# Patient Record
Sex: Female | Born: 1970 | Race: White | Hispanic: No | Marital: Married | Smoking: Never smoker
Health system: Southern US, Community
[De-identification: ages and names within clinical notes are randomized; demographics above are authoritative.]

## PROBLEM LIST (undated history)

## (undated) DIAGNOSIS — Z9049 Acquired absence of other specified parts of digestive tract: Secondary | ICD-10-CM

## (undated) HISTORY — PX: BREAST SURGERY: SHX581

## (undated) HISTORY — DX: Acquired absence of other specified parts of digestive tract: Z90.49

---

## 2008-12-31 ENCOUNTER — Ambulatory Visit: Payer: Self-pay | Admitting: Internal Medicine

## 2008-12-31 ENCOUNTER — Telehealth: Payer: Self-pay | Admitting: Internal Medicine

## 2008-12-31 ENCOUNTER — Ambulatory Visit: Payer: Self-pay | Admitting: Diagnostic Radiology

## 2008-12-31 ENCOUNTER — Ambulatory Visit (HOSPITAL_BASED_OUTPATIENT_CLINIC_OR_DEPARTMENT_OTHER): Admission: RE | Admit: 2008-12-31 | Discharge: 2008-12-31 | Payer: Self-pay | Admitting: Internal Medicine

## 2008-12-31 DIAGNOSIS — M545 Low back pain, unspecified: Secondary | ICD-10-CM | POA: Insufficient documentation

## 2011-03-03 IMAGING — CR DG LUMBAR SPINE 2-3V
3 series · 3 of 3 positions shown · non-contrast
Comparison: None

CLINICAL DATA: Chronic low back pain.

LUMBAR SPINE - 2-3 VIEW

[t l-spine a.p.]
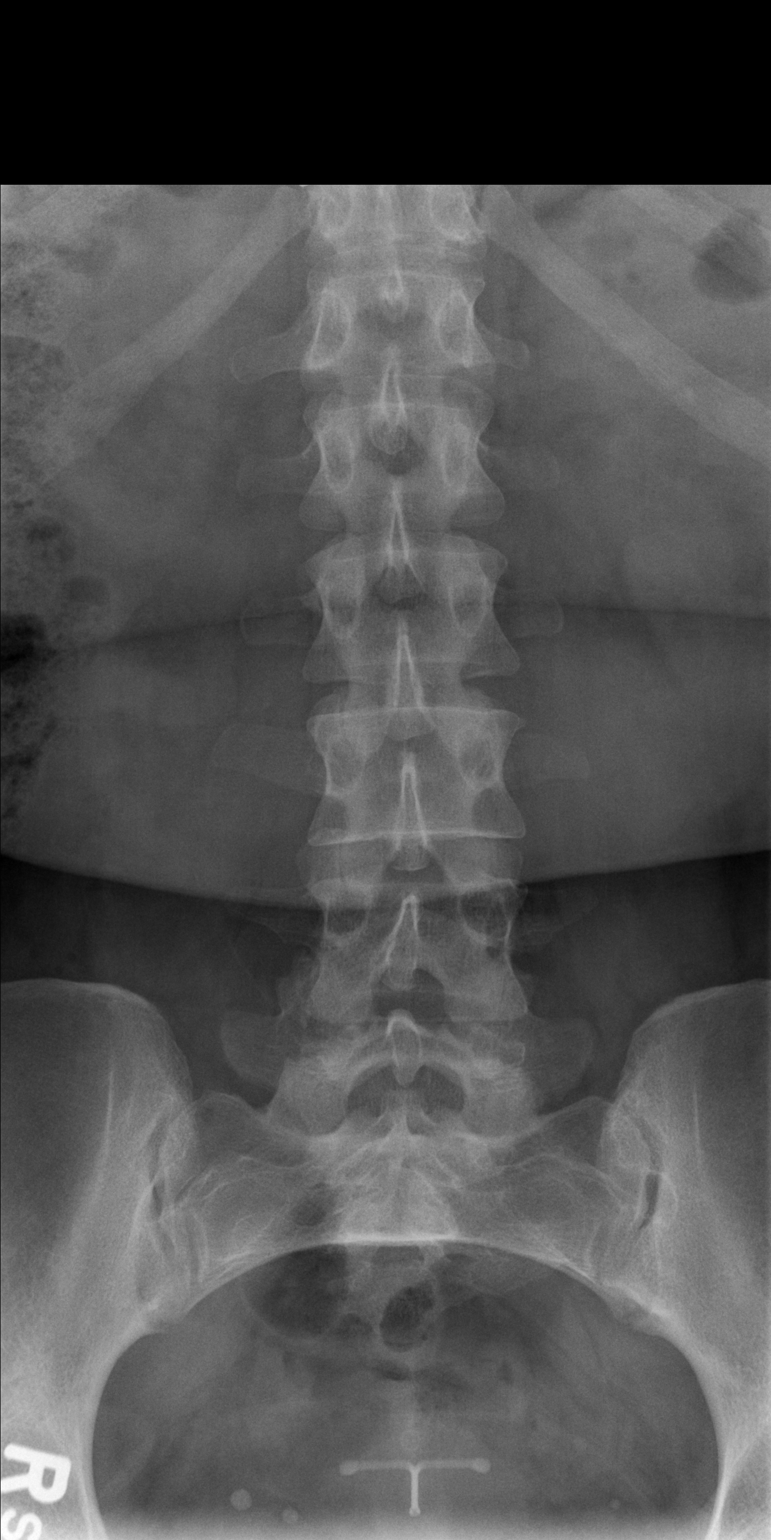

[t l-spine lat]
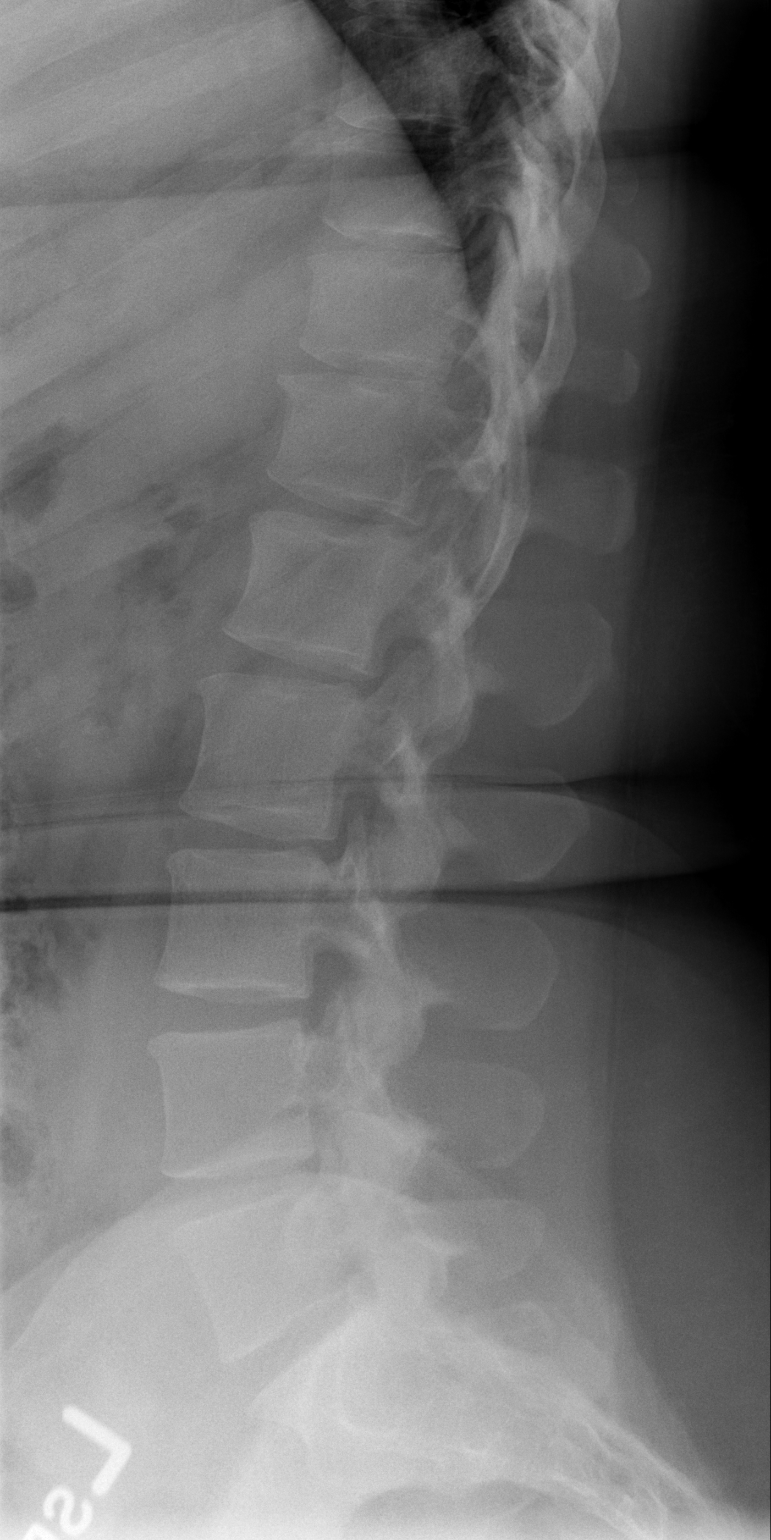

[t l-spine l5-s1 spot]
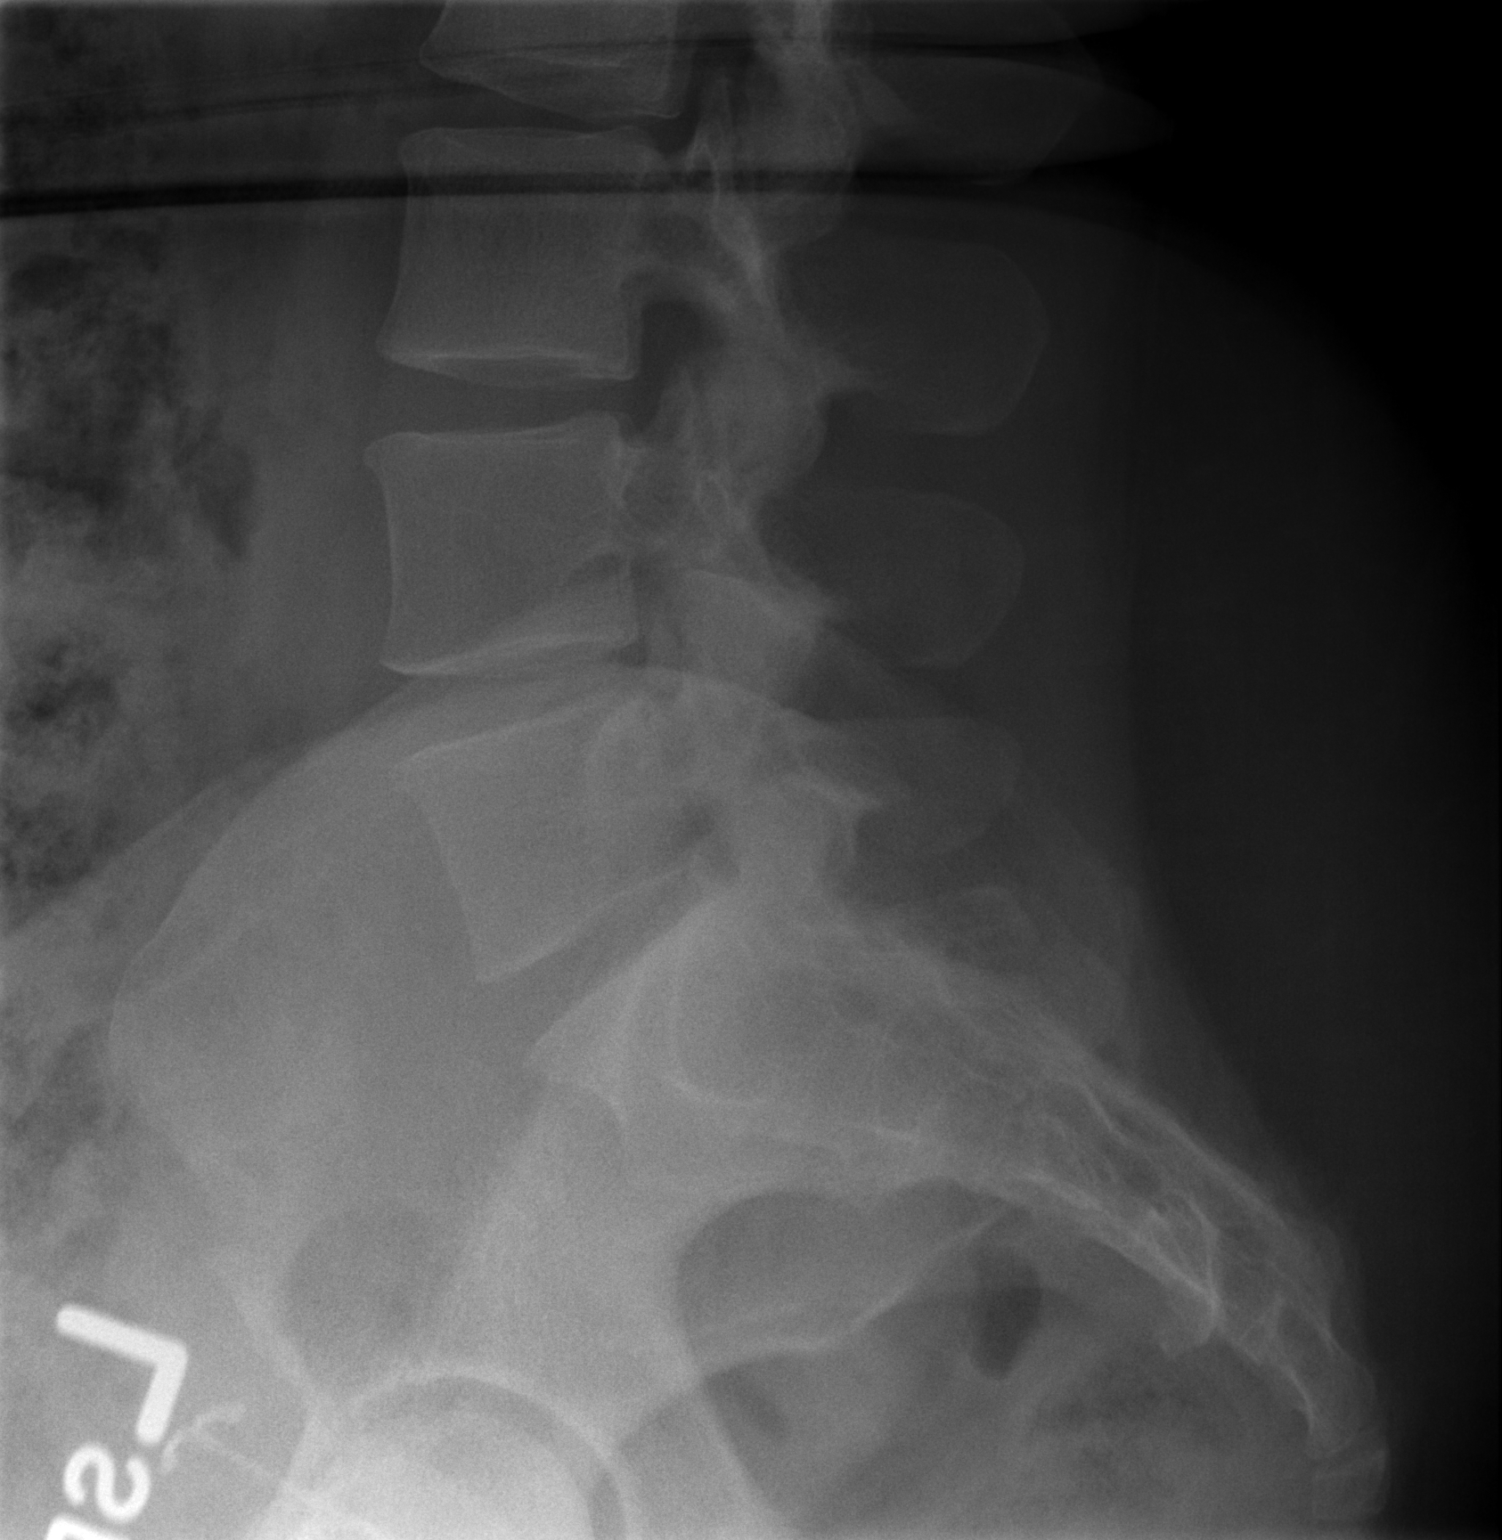

[3 of 3 positions shown; findings below may reference images not displayed]

FINDINGS: The lateral film demonstrates normal alignment of the
lumbar vertebral bodies.  Disc spaces and vertebral bodies are
maintained.  No acute bony findings.  No definite pars defects.
The visualized bony pelvis is intact.  An IUD is noted in the
pelvis.
IMPRESSION: Normal alignment and no acute bony findings.

## 2015-07-05 ENCOUNTER — Ambulatory Visit (INDEPENDENT_AMBULATORY_CARE_PROVIDER_SITE_OTHER): Payer: BC Managed Care – PPO | Admitting: Sports Medicine

## 2015-07-05 VITALS — BP 154/87 | HR 87 | Resp 18 | Wt 241.8 lb

## 2015-07-05 DIAGNOSIS — M722 Plantar fascial fibromatosis: Secondary | ICD-10-CM

## 2015-07-05 DIAGNOSIS — B070 Plantar wart: Secondary | ICD-10-CM | POA: Diagnosis not present

## 2015-07-05 MED ORDER — MELOXICAM 15 MG PO TABS: ORAL_TABLET | ORAL | Status: DC

## 2015-07-05 NOTE — Assessment & Plan Note (Signed)
Cryotherapy 2

## 2015-07-05 NOTE — Progress Notes (Signed)
   Subjective:    I'm seeing this patient as a consultation for:  Holly Cloud, NP  CC: Left heel pain  HPI: This is a pleasant 45 year old female has had a several months history of pain that she localizes on the plantar aspect of her  Left heel, severe, persistent, she's been through formal physical therapy, dry needling without any improvement. She is referred here for custom orthotics, further management, and definitive treatment. She has never had injections.  Plantar wart: Left-sided, Has not responded to topical agents prescribed by another provider.  Past medical history, Surgical history, Family history not pertinant except as noted below, Social history, Allergies, and medications have been entered into the medical record, reviewed, and no changes needed.   Review of Systems: No headache, visual changes, nausea, vomiting, diarrhea, constipation, dizziness, abdominal pain, skin rash, fevers, chills, night sweats, weight loss, swollen lymph nodes, body aches, joint swelling, muscle aches, chest pain, shortness of breath, mood changes, visual or auditory hallucinations.   Objective:   General: Well Developed, well nourished, and in no acute distress.  Neuro/Psych: Alert and oriented x3, extra-ocular muscles intact, able to move all 4 extremities, sensation grossly intact. Skin: Warm and dry, no rashes noted.  Respiratory: Not using accessory muscles, speaking in full sentences, trachea midline.  Cardiovascular: Pulses palpable, no extremity edema. Abdomen: Does not appear distended. Left Foot: No visible erythema or swelling. Range of motion is full in all directions. Strength is 5/5 in all directions. No hallux valgus. No pes cavus or pes planus. No abnormal callus noted. No pain over the navicular prominence, or base of fifth metatarsal. Severely tender to palpation of the calcaneal insertion of plantar fascia. No pain at the Achilles insertion. No pain over the calcaneal  bursa. No pain of the retrocalcaneal bursa. No tenderness to palpation over the tarsals, metatarsals, or phalanges. No hallux rigidus or limitus. No tenderness palpation over interphalangeal joints. No pain with compression of the metatarsal heads. Neurovascularly intact distally.  Patient was fitted for a : standard, cushioned, semi-rigid orthotic. The orthotic was heated and afterward the patient stood on the orthotic blank positioned on the orthotic stand. The patient was positioned in subtalar neutral position and 10 degrees of ankle dorsiflexion in a weight bearing stance. After completion of molding, a stable base was applied to the orthotic blank. The blank was ground to a stable position for weight bearing. Size: 7 Base: [employer] safety [employer] and Padding: None The patient ambulated these, and they were very comfortable.  Procedure:  Cryodestruction of 2 left foot plantar warts Consent obtained and verified. Time-out conducted. Noted no overlying erythema, induration, or other signs of local infection. Completed without difficulty using Cryo-Gun. Advised to call if fevers/chills, erythema, induration, drainage, or persistent bleeding.  Impression and Recommendations:   This case required medical decision making of moderate complexity.

## 2015-07-05 NOTE — Assessment & Plan Note (Signed)
Custom orthotics as above, adding meloxicam, continue rehabilitation exercises, return in 2 weeks, injection if no better.

## 2015-07-19 ENCOUNTER — Ambulatory Visit (INDEPENDENT_AMBULATORY_CARE_PROVIDER_SITE_OTHER): Payer: BC Managed Care – PPO | Admitting: Sports Medicine

## 2015-07-19 VITALS — BP 117/84 | HR 84 | Wt 242.0 lb

## 2015-07-19 DIAGNOSIS — M722 Plantar fascial fibromatosis: Secondary | ICD-10-CM | POA: Diagnosis not present

## 2015-07-19 DIAGNOSIS — B070 Plantar wart: Secondary | ICD-10-CM

## 2015-07-19 DIAGNOSIS — L918 Other hypertrophic disorders of the skin: Secondary | ICD-10-CM

## 2015-07-19 NOTE — Assessment & Plan Note (Signed)
Cryotherapy of multiple skin tags on the neck and abdomen

## 2015-07-19 NOTE — Assessment & Plan Note (Signed)
Doing well and nearly resolved with meloxicam, rehabilitation, and custom orthotics. Continues to improve, return as needed.

## 2015-07-19 NOTE — Progress Notes (Signed)
  Subjective:    CC: follow-up  HPI: Plantar fasciitis: Resolving with custom orthotics, rehabilitation, meloxicam.  Plantar wart: Cryotherapy 2, improved but still present  Skin tags: Multiple on the neck and abdomen, desires cryotherapy.  Past medical history, Surgical history, Family history not pertinant except as noted below, Social history, Allergies, and medications have been entered into the medical record, reviewed, and no changes needed.   Review of Systems: No fevers, chills, night sweats, weight loss, chest pain, or shortness of breath.   Objective:    General: Well Developed, well nourished, and in no acute distress.  Neuro: Alert and oriented x3, extra-ocular muscles intact, sensation grossly intact.  HEENT: Normocephalic, atraumatic, pupils equal round reactive to light, neck supple, no masses, no lymphadenopathy, thyroid nonpalpable.  Skin: Warm and dry, no rashes. Cardiac: Regular rate and rhythm, no murmurs rubs or gallops, no lower extremity edema.  Respiratory: Clear to auscultation bilaterally. Not using accessory muscles, speaking in full sentences.  Procedure:  Cryodestruction of left foot plantar wart 2 Consent obtained and verified. Time-out conducted. Noted no overlying erythema, induration, or other signs of local infection. Completed without difficulty using Cryo-Gun. Advised to call if fevers/chills, erythema, induration, drainage, or persistent bleeding.  Procedure:  Cryodestruction of approximately 10 skin tags on the neck and abdomen Consent obtained and verified. Time-out conducted. Noted no overlying erythema, induration, or other signs of local infection. Completed without difficulty using Cryo-Gun. Advised to call if fevers/chills, erythema, induration, drainage, or persistent bleeding.  Impression and Recommendations:

## 2015-07-19 NOTE — Assessment & Plan Note (Signed)
Doing better, repeat cryotherapy 2

## 2015-08-18 ENCOUNTER — Ambulatory Visit (INDEPENDENT_AMBULATORY_CARE_PROVIDER_SITE_OTHER): Payer: BC Managed Care – PPO | Admitting: Sports Medicine

## 2015-08-18 DIAGNOSIS — M722 Plantar fascial fibromatosis: Secondary | ICD-10-CM | POA: Diagnosis not present

## 2015-08-18 NOTE — Assessment & Plan Note (Signed)
Did extremely well, resolved with custom orthotic, unfortunately having a recurrence of pain, plantar fascia injection as above, continue orthotics, return in one month.

## 2015-08-18 NOTE — Progress Notes (Signed)
  Subjective:    CC:  Follow-up  HPI:  plantar fasciitis: Initially did well with custom orthotics, NSAIDs, exercises, no having a recurrence pain is agreeable to proceed with interventional treatment today, pain is severe, persistent, localized on the heel without radiation. No trauma.  Past medical history, Surgical history, Family history not pertinant except as noted below, Social history, Allergies, and medications have been entered into the medical record, reviewed, and no changes needed.   Review of Systems: No fevers, chills, night sweats, weight loss, chest pain, or shortness of breath.   Objective:    General: Well Developed, well nourished, and in no acute distress.  Neuro: Alert and oriented x3, extra-ocular muscles intact, sensation grossly intact.  HEENT: Normocephalic, atraumatic, pupils equal round reactive to light, neck supple, no masses, no lymphadenopathy, thyroid nonpalpable.  Skin: Warm and dry, no rashes. Cardiac: Regular rate and rhythm, no murmurs rubs or gallops, no lower extremity edema.  Respiratory: Clear to auscultation bilaterally. Not using accessory muscles, speaking in full sentences.  Procedure: Real-time Ultrasound Guided Injection of  Left plantar fascia Device: GE Logiq E  Verbal informed consent obtained.  Time-out conducted.  Noted no overlying erythema, induration, or other signs of local infection.  Skin prepped in a sterile fashion.  Local anesthesia: Topical Ethyl chloride.  With sterile technique and under real time ultrasound guidance:   25-gauge advanced just deep to the calcaneal origin of the plantar fascia, 1 mL kenalog 40, 1 mL lidocaine, 1 mL Marcaine injected easily. Completed without difficulty  Pain immediately resolved suggesting accurate placement of the medication.  Advised to call if fevers/chills, erythema, induration, drainage, or persistent bleeding.  Images permanently stored and available for review in the ultrasound unit.   Impression: Technically successful ultrasound guided injection.  Impression and Recommendations:

## 2015-08-22 ENCOUNTER — Ambulatory Visit: Payer: Self-pay | Admitting: Sports Medicine

## 2015-09-15 ENCOUNTER — Ambulatory Visit (INDEPENDENT_AMBULATORY_CARE_PROVIDER_SITE_OTHER): Payer: BC Managed Care – PPO | Admitting: Sports Medicine

## 2015-09-15 VITALS — BP 110/76 | HR 93 | Resp 18 | Wt 244.0 lb

## 2015-09-15 DIAGNOSIS — M722 Plantar fascial fibromatosis: Secondary | ICD-10-CM

## 2015-09-15 NOTE — Assessment & Plan Note (Signed)
Good response to plantar fascia injection, pain is almost completely gone, occasional episodes of pain when exercising. She will slowly ease back into exercising starting with the machine such as the elliptical. Return to see me on an as-needed basis, we can consider an additional injection, before proceeding to any kind of surgical intervention.

## 2015-09-15 NOTE — Progress Notes (Signed)
  Subjective:    CC:  Follow-up  HPI: Left plantar fasciitis: Resolved after injection. Has a few seconds of intermittent pain here and there.  Past medical history, Surgical history, Family history not pertinant except as noted below, Social history, Allergies, and medications have been entered into the medical record, reviewed, and no changes needed.   Review of Systems: No fevers, chills, night sweats, weight loss, chest pain, or shortness of breath.   Objective:    General: Well Developed, well nourished, and in no acute distress.  Neuro: Alert and oriented x3, extra-ocular muscles intact, sensation grossly intact.  HEENT: Normocephalic, atraumatic, pupils equal round reactive to light, neck supple, no masses, no lymphadenopathy, thyroid nonpalpable.  Skin: Warm and dry, no rashes. Cardiac: Regular rate and rhythm, no murmurs rubs or gallops, no lower extremity edema.  Respiratory: Clear to auscultation bilaterally. Not using accessory muscles, speaking in full sentences.  Impression and Recommendations:

## 2016-01-17 ENCOUNTER — Ambulatory Visit (INDEPENDENT_AMBULATORY_CARE_PROVIDER_SITE_OTHER): Payer: BC Managed Care – PPO | Admitting: Sports Medicine

## 2016-01-17 DIAGNOSIS — M722 Plantar fascial fibromatosis: Secondary | ICD-10-CM

## 2016-01-17 NOTE — Progress Notes (Signed)
  Subjective:    CC: L plantar fasciitis pain  HPI: 45 yo F with history of L plantar fasciitis presenting with three weeks of recurrent plantar fasciitis pain on L foot.  Patient said pain started three weeks ago and has been getting progressively worse.  Pain is similar to plantar fasciitis pain in the past.  She has taken Ibuprofen for pain, with some relief.  She is hoping for another injection today, as her previous injection in March gave her months of relief.  She has not been wearing her orthotics consistently or doing PT exercises.   Past medical history, Surgical history, Family history not pertinant except as noted below, Social history, Allergies, and medications have been entered into the medical record, reviewed, and no changes needed.   Review of Systems: No fevers, chills, night sweats, weight loss, chest pain, or shortness of breath.   Objective:    General: Well Developed, well nourished, and in no acute distress.  Neuro: Alert and oriented x3, extra-ocular muscles intact, sensation grossly intact.  HEENT: Normocephalic, atraumatic, pupils equal round reactive to light, neck supple, no masses, no lymphadenopathy, thyroid nonpalpable.  Skin: Warm and dry, no rashes. Cardiac: Regular rate and rhythm, no murmurs rubs or gallops, no lower extremity edema.  Respiratory: Clear to auscultation bilaterally. Not using accessory muscles, speaking in full sentences. L Foot: No visible erythema or swelling. Range of motion is full in all directions. Strength is 5/5 in all directions. No hallux valgus. No pes cavus or pes planus. No abnormal callus noted. No pain over the navicular prominence, or base of fifth metatarsal. Mild TTP over plantar fascia.  No pain at the Achilles insertion. No pain over the calcaneal bursa. No pain of the retrocalcaneal bursa. No tenderness to palpation over the tarsals, metatarsals, or phalanges. No hallux rigidus or limitus. No tenderness palpation  over interphalangeal joints. No pain with compression of the metatarsal heads. Neurovascularly intact distally.    Impression and Recommendations:    1. Plantar fasciitis -Restart PT exercises -Wear orthotics on a daily basis -Return in 2 weeks if pain does not improve with exercises and orthotics.  Will consider injection at that time.

## 2016-01-17 NOTE — Assessment & Plan Note (Signed)
Having a recurrence of pain, previous injection was many months ago, Holly Floyd will be more diligent with the rehabilitation exercises, and more diligent with the custom orthotics before we consider another injection. Pain is not directly over the plantar fascia today.

## 2017-07-04 ENCOUNTER — Other Ambulatory Visit: Payer: Self-pay

## 2017-07-04 ENCOUNTER — Emergency Department
Admission: EM | Admit: 2017-07-04 | Discharge: 2017-07-04 | Disposition: A | Discharge status: Home / Self Care | Payer: BC Managed Care – PPO | Source: Home / Self Care | Attending: Family Medicine | Admitting: Family Medicine

## 2017-07-04 ENCOUNTER — Encounter: Payer: Self-pay | Admitting: Emergency Medicine

## 2017-07-04 DIAGNOSIS — J40 Bronchitis, not specified as acute or chronic: Secondary | ICD-10-CM

## 2017-07-04 DIAGNOSIS — R059 Cough, unspecified: Secondary | ICD-10-CM

## 2017-07-04 DIAGNOSIS — R05 Cough: Secondary | ICD-10-CM | POA: Diagnosis not present

## 2017-07-04 LAB — POCT INFLUENZA A/B
Influenza A, POC: NEGATIVE
Influenza B, POC: NEGATIVE

## 2017-07-04 MED ORDER — AZITHROMYCIN 250 MG PO TABS: ORAL_TABLET | ORAL | 0 refills | Status: DC

## 2017-07-04 MED ORDER — BENZONATATE 200 MG PO CAPS: ORAL_CAPSULE | ORAL | 0 refills | Status: DC

## 2017-07-04 NOTE — ED Provider Notes (Signed)
[facility]    CSN: [id number] Arrival date & time: 07/04/17  1513     History   Chief Complaint Chief Complaint  Patient presents with  . Cough    HPI Holly Floyd is a 47 y.o. female.   Two days ago patient developed cough productive of thick sputum, mild sore throat, fever to 99/chills, headache, and fatigue.  No pleuritic pain or shortness of breath. She has a past history of pneumonia.  Her son has the flu.   The history is provided by the patient.    History reviewed. No pertinent past medical history.  Patient Active Problem List   Diagnosis Date Noted  . Plantar fasciitis of left foot 07/05/2015  . Plantar wart of left foot 07/05/2015      OB History    No data available       Home Medications    Prior to Admission medications   Medication Sig Start Date End Date Taking? Authorizing Provider  guaiFENesin (MUCINEX) 600 MG 12 hr tablet Take by mouth 2 (two) times daily.   Yes [provider]  guaiFENesin (ROBITUSSIN) 100 MG/5ML liquid Take 200 mg by mouth 3 (three) times daily as needed for cough.   Yes [provider]  azithromycin (ZITHROMAX Z-PAK) 250 MG tablet Take 2 tabs today; then begin one tab once daily for 4 more days. 07/04/17   Kandra Nicolas, MD  benzonatate (TESSALON) 200 MG capsule Take one cap by mouth at bedtime as needed for cough.  May repeat in 4 to 6 hours 07/04/17   Kandra Nicolas, MD    Family History No family history on file.  Social History Social History   Tobacco Use  . Smoking status: Never Smoker  . Smokeless tobacco: Never Used  Substance Use Topics  . Alcohol use: No    Frequency: Never  . Drug use: No     Allergies   Patient has no known allergies.   Review of Systems Review of Systems + sore throat + cough No pleuritic pain No wheezing + nasal congestion + post-nasal drainage No sinus pain/pressure No itchy/red eyes No earache No hemoptysis No SOB + fever, +  chills No nausea No vomiting No abdominal pain No diarrhea No urinary symptoms No skin rash + fatigue No myalgias + headache Used OTC meds without relief   Physical Exam Triage Vital Signs ED Triage Vitals  Enc Vitals Group     BP 07/04/17 1551 113/80     Pulse Rate 07/04/17 1551 85     Resp --      Temp 07/04/17 1551 98.1 F (36.7 C)     Temp Source 07/04/17 1551 Oral     SpO2 07/04/17 1551 98 %     Weight 07/04/17 1552 243 lb (110.2 kg)     Height 07/04/17 1552 5' 4"  (1.626 m)     Head Circumference --      Peak Flow --      Pain Score 07/04/17 1552 0     Pain Loc --      Pain Edu? --      Excl. in [place]? --    No data found.  Updated Vital Signs BP 113/80 (BP Location: Right Arm)   Pulse 85   Temp 98.1 F (36.7 C) (Oral)   Ht 5' 4"  (1.626 m)   Wt 243 lb (110.2 kg)   SpO2 98%   BMI 41.71 kg/m   Visual Acuity Right Eye  Distance:   Left Eye Distance:   Bilateral Distance:    Right Eye Near:   Left Eye Near:    Bilateral Near:     Physical Exam Nursing notes and Vital Signs reviewed. Appearance:  Patient appears stated age, and in no acute distress Eyes:  Pupils are equal, round, and reactive to light and accomodation.  Extraocular movement is intact.  Conjunctivae are not inflamed  Ears:  Canals normal.  Tympanic membranes normal.  Nose:  Mildly congested turbinates.  No sinus tenderness. Pharynx:  Normal Neck:  Supple. No adenopathy. Lungs:  Clear to auscultation.  Breath sounds are equal.  Moving air well. Heart:  Regular rate and rhythm without murmurs, rubs, or gallops.  Abdomen:  Nontender without masses or hepatosplenomegaly.  Bowel sounds are present.  No CVA or flank tenderness.  Extremities:  No edema.  Skin:  No rash present.    UC Treatments / Results  Labs (all labs ordered are listed, but only abnormal results are displayed) Labs Reviewed  POCT INFLUENZA A/B negative    EKG  EKG Interpretation None       Radiology No results  found.  Procedures Procedures (including critical care time)  Medications Ordered in UC Medications - No data to display   Initial Impression / Assessment and Plan / UC Course  I have reviewed the triage vital signs and the nursing notes.  Pertinent labs & imaging results that were available during my care of the patient were reviewed by me and considered in my medical decision making (see chart for details).    Begin Z-pak for atypical coverage. Prescription written for Benzonatate [facility]) to take at bedtime for night-time cough.  Take plain guaifenesin (1266m extended release tabs such as Mucinex) twice daily, with plenty of water, for cough and congestion.  May add Pseudoephedrine (384m one or two every 4 to 6 hours) for sinus congestion.  Get adequate rest.   May use Afrin nasal spray (or generic oxymetazoline) each morning for about 5 days and then discontinue.  Also recommend using saline nasal spray several times daily and saline nasal irrigation (AYR is a common brand).   Try warm salt water gargles for sore throat.  Stop all antihistamines for now, and other non-prescription cough/cold preparations. May take Ibuprofen 20049m4 tabs every 8 hours with food for fever, headache, body aches, etc. Followup with Family Doctor if not improved in one week.     Final Clinical Impressions(s) / UC Diagnoses   Final diagnoses:  Cough  Bronchitis    ED Discharge Orders        Ordered    azithromycin (ZITHROMAX Z-PAK) 250 MG tablet     07/04/17 1701    benzonatate (TESSALON) 200 MG capsule     07/04/17 1701           BeeKandra NicolasD 07/07/17 1238

## 2017-07-04 NOTE — Discharge Instructions (Signed)
Take plain guaifenesin (1239m extended release tabs such as Mucinex) twice daily, with plenty of water, for cough and congestion.  May add Pseudoephedrine (368m one or two every 4 to 6 hours) for sinus congestion.  Get adequate rest.   May use Afrin nasal spray (or generic oxymetazoline) each morning for about 5 days and then discontinue.  Also recommend using saline nasal spray several times daily and saline nasal irrigation (AYR is a common brand).   Try warm salt water gargles for sore throat.  Stop all antihistamines for now, and other non-prescription cough/cold preparations. May take Ibuprofen 20056m4 tabs every 8 hours with food for fever, headache, body aches, etc.

## 2017-07-04 NOTE — ED Triage Notes (Signed)
Productive cough with thick yellow mucus x 2 days

## 2018-05-12 ENCOUNTER — Emergency Department
Admission: EM | Admit: 2018-05-12 | Discharge: 2018-05-12 | Discharge status: Absent without leave | Payer: BC Managed Care – PPO | Source: Home / Self Care

## 2019-12-29 ENCOUNTER — Emergency Department (HOSPITAL_BASED_OUTPATIENT_CLINIC_OR_DEPARTMENT_OTHER)
Admission: EM | Admit: 2019-12-29 | Discharge: 2019-12-29 | Discharge status: Against Medical Advice | Payer: BC Managed Care – PPO

## 2019-12-29 ENCOUNTER — Other Ambulatory Visit: Payer: Self-pay

## 2020-11-16 LAB — HM PAP SMEAR: HM Pap smear: NEGATIVE

## 2020-11-16 LAB — RESULTS CONSOLE HPV: CHL HPV: NEGATIVE

## 2020-11-22 ENCOUNTER — Encounter: Payer: Self-pay | Admitting: Family

## 2020-11-22 ENCOUNTER — Ambulatory Visit: Payer: BC Managed Care – PPO | Admitting: Family

## 2020-11-22 ENCOUNTER — Other Ambulatory Visit: Payer: Self-pay

## 2020-11-22 VITALS — BP 130/80 | HR 76 | Temp 97.8°F | Ht 63.5 in | Wt 256.0 lb

## 2020-11-22 DIAGNOSIS — Z0000 Encounter for general adult medical examination without abnormal findings: Secondary | ICD-10-CM | POA: Diagnosis not present

## 2020-11-22 DIAGNOSIS — Z13220 Encounter for screening for lipoid disorders: Secondary | ICD-10-CM

## 2020-11-22 DIAGNOSIS — Z1211 Encounter for screening for malignant neoplasm of colon: Secondary | ICD-10-CM | POA: Diagnosis not present

## 2020-11-22 LAB — LIPID PANEL
Cholesterol: 202 mg/dL — ABNORMAL HIGH (ref 0–200)
HDL: 50 mg/dL (ref >39.00)
LDL Cholesterol: 128 mg/dL — ABNORMAL HIGH (ref 0–99)
NonHDL: 151.65
Total CHOL/HDL Ratio: 4
Triglycerides: 116 mg/dL (ref 0.0–149.0)
VLDL: 23.2 mg/dL (ref 0.0–40.0)

## 2020-11-22 LAB — CBC WITH DIFFERENTIAL/PLATELET
Basophils Absolute: 0.1 10*3/uL (ref 0.0–0.1)
Basophils Relative: 0.7 % (ref 0.0–3.0)
Eosinophils Absolute: 0.1 10*3/uL (ref 0.0–0.7)
Eosinophils Relative: 1.4 % (ref 0.0–5.0)
HCT: 38.9 % (ref 36.0–46.0)
Hemoglobin: 13.4 g/dL (ref 12.0–15.0)
Lymphocytes Relative: 18 % (ref 12.0–46.0)
Lymphs Abs: 1.8 10*3/uL (ref 0.7–4.0)
MCHC: 34.5 g/dL (ref 30.0–36.0)
MCV: 92.4 fl (ref 78.0–100.0)
Monocytes Absolute: 0.6 10*3/uL (ref 0.1–1.0)
Monocytes Relative: 6 % (ref 3.0–12.0)
Neutro Abs: 7.4 10*3/uL (ref 1.4–7.7)
Neutrophils Relative %: 73.9 % (ref 43.0–77.0)
Platelets: 250 10*3/uL (ref 150.0–400.0)
RBC: 4.22 Mil/uL (ref 3.87–5.11)
RDW: 12.6 % (ref 11.5–15.5)
WBC: 10 10*3/uL (ref 4.0–10.5)

## 2020-11-22 LAB — COMPREHENSIVE METABOLIC PANEL
ALT: 14 U/L (ref 0–35)
AST: 13 U/L (ref 0–37)
Albumin: 4.1 g/dL (ref 3.5–5.2)
Alkaline Phosphatase: 91 U/L (ref 39–117)
BUN: 18 mg/dL (ref 6–23)
CO2: 27 mEq/L (ref 19–32)
Calcium: 9 mg/dL (ref 8.4–10.5)
Chloride: 103 mEq/L (ref 96–112)
Creatinine, Ser: 0.74 mg/dL (ref 0.40–1.20)
GFR: 95.41 mL/min (ref >60.00)
Glucose, Bld: 97 mg/dL (ref 70–99)
Potassium: 4.1 mEq/L (ref 3.5–5.1)
Sodium: 138 mEq/L (ref 135–145)
Total Bilirubin: 0.5 mg/dL (ref 0.2–1.2)
Total Protein: 6.9 g/dL (ref 6.0–8.3)

## 2020-11-22 LAB — TSH: TSH: 2.44 u[IU]/mL (ref 0.35–5.50)

## 2020-11-22 NOTE — Patient Instructions (Signed)
Dr. Darene Lamer in [place] ( plantar fasciitis) [phone]

## 2020-11-22 NOTE — Progress Notes (Signed)
Holly Floyd is a 50 y.o. female with the following history as recorded in [employer]:  Patient Active Problem List   Diagnosis Date Noted   Plantar fasciitis of left foot 07/05/2015   Plantar wart of left foot 07/05/2015    Current Outpatient Medications  Medication Sig Dispense Refill   Ascorbic Acid (VITAMIN C) 1000 MG tablet Take 1,000 mg by mouth daily.     Biotin w/ Vitamins C & E (HAIR SKIN & NAILS GUMMIES PO) Take by mouth.     calcium-vitamin D (OSCAL WITH D) 500-200 MG-UNIT tablet Take 1 tablet by mouth.     levonorgestrel (MIRENA) 20 MCG/DAY IUD by Intrauterine route.     zinc gluconate 50 MG tablet Take 50 mg by mouth daily.     No current facility-administered medications for this visit.    Allergies: Patient has no known allergies.  Past Medical History:  Diagnosis Date   S/P laparoscopic cholecystectomy     The histories are not reviewed yet. Please review them in the "History" navigator section and refresh this [place].  Family History  Adopted: Yes    Social History   Tobacco Use   Smoking status: Never   Smokeless tobacco: Never  Substance Use Topics   Alcohol use: No    Subjective:  Presents as a new patient; needs yearly CPE updated; has already seen GYN earlier this summer; scheduled to see dentist later this summer; No acute concerns today; planning to start working on lifestyle changes ( with husband)- goal is to work on losing at least 25 pounds;   Review of Systems  Constitutional: Negative.   HENT: Negative.    Eyes: Negative.   Respiratory: Negative.    Cardiovascular: Negative.   Gastrointestinal: Negative.   Genitourinary: Negative.   Musculoskeletal: Negative.   Skin: Negative.   Neurological: Negative.   Endo/Heme/Allergies: Negative.   Psychiatric/Behavioral: Negative.         Objective:  Vitals:   11/22/20 0853  BP: 130/80  Pulse: 76  Temp: 97.8 F (36.6 C)  TempSrc: Oral  SpO2: 99%  Weight: 256 lb (116.1 kg)   Height: 5' 3.5" (1.613 m)    General: Well developed, well nourished, in no acute distress  Skin : Warm and dry.  Head: Normocephalic and atraumatic  Eyes: Sclera and conjunctiva clear; pupils round and reactive to light; extraocular movements intact  Ears: External normal; canals clear; tympanic membranes normal  Oropharynx: Pink, supple. No suspicious lesions  Neck: Supple without thyromegaly, adenopathy  Lungs: Respirations unlabored; clear to auscultation bilaterally without wheeze, rales, rhonchi  CVS exam: normal rate and regular rhythm.  Abdomen: Soft; nontender; nondistended; normoactive bowel sounds; no masses or hepatosplenomegaly  Musculoskeletal: No deformities; no active joint inflammation  Extremities: No edema, cyanosis, clubbing  Vessels: Symmetric bilaterally  Neurologic: Alert and oriented; speech intact; face symmetrical; moves all extremities well; CNII-XII intact without focal deficit   Assessment:  1. PE (physical exam), annual   2. Lipid screening   3. Colon cancer screening     Plan:  Age appropriate preventive healthcare needs addressed; encouraged regular eye doctor and dental exams; encouraged regular exercise and weight loss; will update labs and refills as needed today; follow-up to be determined; Cologuard ordered- risks/ benefits of tests discussed today;  This visit occurred during the SARS-CoV-2 public health emergency.  Safety protocols were in place, including screening questions prior to the visit, additional usage of staff PPE, and extensive cleaning of exam room while observing appropriate  contact time as indicated for disinfecting solutions.     No follow-ups on file.  Orders Placed This Encounter  Procedures   CBC with Differential/Platelet   Comp Met (CMET)   Lipid panel   TSH   Cologuard    Requested Prescriptions    No prescriptions requested or ordered in this encounter

## 2021-02-24 LAB — COLOGUARD: Cologuard: NEGATIVE

## 2021-11-23 ENCOUNTER — Ambulatory Visit (INDEPENDENT_AMBULATORY_CARE_PROVIDER_SITE_OTHER): Payer: BC Managed Care – PPO | Admitting: Family

## 2021-11-23 VITALS — BP 124/78 | HR 82 | Temp 97.7°F | Resp 16 | Ht 63.0 in | Wt 218.2 lb

## 2021-11-23 DIAGNOSIS — Z0000 Encounter for general adult medical examination without abnormal findings: Secondary | ICD-10-CM | POA: Diagnosis not present

## 2021-11-23 NOTE — Progress Notes (Signed)
Holly Floyd is a 51 y.o. female with the following history as recorded in EpicCare:  Patient Active Problem List   Diagnosis Date Noted   Plantar fasciitis of left foot 07/05/2015   Plantar wart of left foot 07/05/2015    Current Outpatient Medications  Medication Sig Dispense Refill   Ascorbic Acid (VITAMIN C) 1000 MG tablet Take 1,000 mg by mouth daily.     Biotin w/ Vitamins C & E (HAIR SKIN & NAILS GUMMIES PO) Take by mouth.     calcium-vitamin D (OSCAL WITH D) 500-200 MG-UNIT tablet Take 1 tablet by mouth.     levonorgestrel (MIRENA) 20 MCG/DAY IUD by Intrauterine route.     Semaglutide-Weight Management (WEGOVY) 2.4 MG/0.75ML SOAJ Inject 2.4 mg into the skin.     zinc gluconate 50 MG tablet Take 50 mg by mouth daily.     No current facility-administered medications for this visit.    Allergies: Patient has no known allergies.  Past Medical History:  Diagnosis Date   S/P laparoscopic cholecystectomy     Past Surgical History:  Procedure Laterality Date   BREAST SURGERY     CESAREAN SECTION      Family History  Adopted: Yes    Social History   Tobacco Use   Smoking status: Never   Smokeless tobacco: Never  Substance Use Topics   Alcohol use: No    Subjective:  Presents for yearly CPE; does have GYN- up to date/ saw yesterday; Has been working with [employer] through [place]- doing well on current regimen; has lost 40 pounds since last CPE in 2022;   Son will be starting college this year- excited and emotional about the potential for him;   LMP-Mirena ( placed 2019)  Review of Systems  Constitutional:  Positive for weight loss.       Planned weight loss  HENT: Negative.    Eyes: Negative.   Respiratory: Negative.    Cardiovascular: Negative.   Gastrointestinal: Negative.   Genitourinary: Negative.   Musculoskeletal: Negative.   Skin: Negative.   Neurological: Negative.   Endo/Heme/Allergies: Negative.   Psychiatric/Behavioral: Negative.           Objective:  Vitals:   11/23/21 1032  BP: 124/78  Pulse: 82  Resp: 16  Temp: 97.7 F (36.5 C)  TempSrc: Oral  SpO2: 100%  Weight: 218 lb 3.2 oz (99 kg)  Height: 5\' 3"  (1.6 m)    General: Well developed, well nourished, in no acute distress  Skin : Warm and dry.  Head: Normocephalic and atraumatic  Eyes: Sclera and conjunctiva clear; pupils round and reactive to light; extraocular movements intact  Ears: External normal; canals clear; tympanic membranes normal  Oropharynx: Pink, supple. No suspicious lesions  Neck: Supple without thyromegaly, adenopathy  Lungs: Respirations unlabored; clear to auscultation bilaterally without wheeze, rales, rhonchi  CVS exam: normal rate and regular rhythm.  Abdomen: Soft; nontender; nondistended; normoactive bowel sounds; no masses or hepatosplenomegaly  Musculoskeletal: No deformities; no active joint inflammation  Extremities: No edema, cyanosis, clubbing  Vessels: Symmetric bilaterally  Neurologic: Alert and oriented; speech intact; face symmetrical; moves all extremities well; CNII-XII intact without focal deficit   Assessment:  1. PE (physical exam), annual     Plan:  Age appropriate preventive healthcare needs addressed; encouraged regular eye doctor and dental exams; encouraged regular exercise; will update labs and refills as needed today; follow-up to be determined; Congratulated patient on weight loss/ commitment to her health; she feels like she has  plateaued but will be able to get back on track once she gets her son off to college; she will plan to see her weight loss provider again and may decide to reach out to if she would like our office to take over [facility] for her.   No follow-ups on file.  Orders Placed This Encounter  Procedures   HM PAP SMEAR    This external order was created through the Results Console.    Requested Prescriptions    No prescriptions requested or ordered in this encounter

## 2023-02-27 LAB — HM MAMMOGRAPHY

## 2023-05-14 ENCOUNTER — Telehealth: Payer: Self-pay | Admitting: Family

## 2023-05-14 NOTE — Telephone Encounter (Signed)
 LVM advising patient is overdue for physical. Requested call back (or use mychart) to schedule appt or update [place] if they have changed PCPs.

## 2023-05-17 ENCOUNTER — Telehealth: Payer: Self-pay

## 2023-05-17 NOTE — Telephone Encounter (Signed)
 Copied from CRM [id number]. Topic: Clinical - Request for Lab/Test Order >> May 17, 2023 10:34 AM Corin V wrote: Reason for CRM: Patient called in to schedule her annual physical and labs. Please enter any lab orders that will need to be reviewed during the physical appointment.

## 2023-05-17 NOTE — Telephone Encounter (Signed)
 Pt requesting lab orders for cpx that she has later this month please.

## 2023-05-22 ENCOUNTER — Other Ambulatory Visit: Payer: Self-pay | Admitting: Family

## 2023-05-22 DIAGNOSIS — Z0000 Encounter for general adult medical examination without abnormal findings: Secondary | ICD-10-CM

## 2023-05-22 DIAGNOSIS — Z13220 Encounter for screening for lipoid disorders: Secondary | ICD-10-CM

## 2023-05-22 NOTE — Telephone Encounter (Signed)
 Orders placed. Pt scheduled for lab appt already.

## 2023-06-03 ENCOUNTER — Other Ambulatory Visit (INDEPENDENT_AMBULATORY_CARE_PROVIDER_SITE_OTHER): Payer: 59

## 2023-06-03 DIAGNOSIS — Z13220 Encounter for screening for lipoid disorders: Secondary | ICD-10-CM

## 2023-06-03 DIAGNOSIS — Z131 Encounter for screening for diabetes mellitus: Secondary | ICD-10-CM | POA: Diagnosis not present

## 2023-06-03 DIAGNOSIS — Z0000 Encounter for general adult medical examination without abnormal findings: Secondary | ICD-10-CM

## 2023-06-03 LAB — CBC WITH DIFFERENTIAL/PLATELET
Basophils Absolute: 0.1 10*3/uL (ref 0.0–0.1)
Basophils Relative: 0.8 % (ref 0.0–3.0)
Eosinophils Absolute: 0.1 10*3/uL (ref 0.0–0.7)
Eosinophils Relative: 1.9 % (ref 0.0–5.0)
HCT: 43.8 % (ref 36.0–46.0)
Hemoglobin: 14.8 g/dL (ref 12.0–15.0)
Lymphocytes Relative: 27.6 % (ref 12.0–46.0)
Lymphs Abs: 1.9 10*3/uL (ref 0.7–4.0)
MCHC: 33.7 g/dL (ref 30.0–36.0)
MCV: 95.1 fL (ref 78.0–100.0)
Monocytes Absolute: 0.5 10*3/uL (ref 0.1–1.0)
Monocytes Relative: 6.9 % (ref 3.0–12.0)
Neutro Abs: 4.3 10*3/uL (ref 1.4–7.7)
Neutrophils Relative %: 62.8 % (ref 43.0–77.0)
Platelets: 295 10*3/uL (ref 150.0–400.0)
RBC: 4.6 Mil/uL (ref 3.87–5.11)
RDW: 12.6 % (ref 11.5–15.5)
WBC: 6.8 10*3/uL (ref 4.0–10.5)

## 2023-06-03 LAB — COMPREHENSIVE METABOLIC PANEL
ALT: 18 U/L (ref 0–35)
AST: 16 U/L (ref 0–37)
Albumin: 4.6 g/dL (ref 3.5–5.2)
Alkaline Phosphatase: 83 U/L (ref 39–117)
BUN: 17 mg/dL (ref 6–23)
CO2: 28 meq/L (ref 19–32)
Calcium: 9.8 mg/dL (ref 8.4–10.5)
Chloride: 104 meq/L (ref 96–112)
Creatinine, Ser: 0.75 mg/dL (ref 0.40–1.20)
GFR: 92.24 mL/min (ref >60.00)
Glucose, Bld: 95 mg/dL (ref 70–99)
Potassium: 4.6 meq/L (ref 3.5–5.1)
Sodium: 141 meq/L (ref 135–145)
Total Bilirubin: 0.6 mg/dL (ref 0.2–1.2)
Total Protein: 7.4 g/dL (ref 6.0–8.3)

## 2023-06-03 LAB — LIPID PANEL
Cholesterol: 238 mg/dL — ABNORMAL HIGH (ref 0–200)
HDL: 54.8 mg/dL (ref >39.00)
LDL Cholesterol: 148 mg/dL — ABNORMAL HIGH (ref 0–99)
NonHDL: 183.34
Total CHOL/HDL Ratio: 4
Triglycerides: 179 mg/dL — ABNORMAL HIGH (ref 0.0–149.0)
VLDL: 35.8 mg/dL (ref 0.0–40.0)

## 2023-06-03 LAB — HEMOGLOBIN A1C: Hgb A1c MFr Bld: 5.9 % (ref 4.6–6.5)

## 2023-06-11 ENCOUNTER — Encounter: Payer: Self-pay | Admitting: Family

## 2023-06-11 ENCOUNTER — Ambulatory Visit (INDEPENDENT_AMBULATORY_CARE_PROVIDER_SITE_OTHER): Payer: 59 | Admitting: Family

## 2023-06-11 VITALS — BP 132/80 | HR 75 | Ht 63.0 in | Wt 251.0 lb

## 2023-06-11 DIAGNOSIS — Z0000 Encounter for general adult medical examination without abnormal findings: Secondary | ICD-10-CM

## 2023-06-11 DIAGNOSIS — R7303 Prediabetes: Secondary | ICD-10-CM

## 2023-06-11 NOTE — Progress Notes (Signed)
Reviewed with patient at time of OV;

## 2023-06-11 NOTE — Patient Instructions (Signed)
[facility] is an option you can look into; per the information I have been given, it's $299/ month;

## 2023-06-11 NOTE — Progress Notes (Signed)
Holly Floyd is a 53 y.o. female with the following history as recorded in EpicCare:  Patient Active Problem List   Diagnosis Date Noted   Plantar fasciitis of left foot 07/05/2015   Plantar wart of left foot 07/05/2015    Current Outpatient Medications  Medication Sig Dispense Refill   Ascorbic Acid (VITAMIN C) 1000 MG tablet Take 1,000 mg by mouth daily.     Biotin w/ Vitamins C & E (HAIR SKIN & NAILS GUMMIES PO) Take by mouth.     calcium-vitamin D (OSCAL WITH D) 500-200 MG-UNIT tablet Take 1 tablet by mouth.     levonorgestrel (MIRENA) 20 MCG/DAY IUD by Intrauterine route.     Multiple Vitamin (MULTIVITAMIN) tablet Take 1 tablet by mouth daily.     No current facility-administered medications for this visit.    Allergies: Patient has no known allergies.  Past Medical History:  Diagnosis Date   S/P laparoscopic cholecystectomy     Past Surgical History:  Procedure Laterality Date   BREAST SURGERY     CESAREAN SECTION      Family History  Adopted: Yes    Social History   Tobacco Use   Smoking status: Never   Smokeless tobacco: Never  Substance Use Topics   Alcohol use: No    Subjective:   Presents for yearly CPE; had fasting labs done prior; does have GYN- mammogram was done in October 2024;   Review of Systems  Constitutional: Negative.   HENT: Negative.    Eyes: Negative.   Respiratory: Negative.    Cardiovascular: Negative.   Gastrointestinal: Negative.   Genitourinary: Negative.   Musculoskeletal: Negative.   Skin: Negative.   Neurological: Negative.   Endo/Heme/Allergies: Negative.   Psychiatric/Behavioral: Negative.        The 10-year ASCVD risk score (Arnett DK, et al., 2019) is: 1.8%   Values used to calculate the score:     Age: 55 years     Sex: Female     Is Non-Hispanic African American: No     Diabetic: No     Tobacco smoker: No     Systolic Blood Pressure: 132 mmHg     Is BP treated: No     HDL Cholesterol: 54.8 mg/dL     Total  Cholesterol: 238 mg/dL  Objective:  Vitals:   06/11/23 1512  BP: 132/80  Pulse: 75  SpO2: 98%  Weight: 251 lb (113.9 kg)  Height: 5\' 3"  (1.6 m)    General: Well developed, well nourished, in no acute distress  Skin : Warm and dry.  Head: Normocephalic and atraumatic  Eyes: Sclera and conjunctiva clear; pupils round and reactive to light; extraocular movements intact  Ears: External normal; canals clear; tympanic membranes normal  Oropharynx: Pink, supple. No suspicious lesions  Neck: Supple without thyromegaly, adenopathy  Lungs: Respirations unlabored; clear to auscultation bilaterally without wheeze, rales, rhonchi  CVS exam: normal rate and regular rhythm.  Abdomen: Soft; nontender; nondistended; normoactive bowel sounds; no masses or hepatosplenomegaly  Musculoskeletal: No deformities; no active joint inflammation  Extremities: No edema, cyanosis, clubbing  Vessels: Symmetric bilaterally  Neurologic: Alert and oriented; speech intact; face symmetrical; moves all extremities well; CNII-XII intact without focal deficit   Assessment:  1. PE (physical exam), annual   2. Pre-diabetes     Plan:  Age appropriate preventive healthcare needs addressed; encouraged regular eye doctor and dental exams; encouraged regular exercise; Reviewed labs with patient that were done previously;  Patient is very frustrated that  she is unable to get her GLP-1 covered. Discussed alternative online provider platforms that she may be able to get the medication at a reasonable cost.   No follow-ups on file.  No orders of the defined types were placed in this encounter.   Requested Prescriptions    No prescriptions requested or ordered in this encounter

## 2024-05-05 LAB — HM MAMMOGRAPHY

## 2024-05-08 ENCOUNTER — Telehealth: Payer: Self-pay | Admitting: Family Medicine

## 2024-05-08 NOTE — Telephone Encounter (Signed)
 Noted.   Copied from CRM #[id number]. Topic: Appointments - Transfer of Care >> May 08, 2024  1:23 PM Rea C wrote: Pt is requesting to transfer FROM: JASON DOFFING [facility] NP   Pt is requesting to transfer TO: Darice JONELLE Brownie, FNP Reason for requested transfer: [facility]  It is the responsibility of the team the patient would like to transfer to (NP [facility] ) to reach out to the patient if for any reason this transfer is not acceptable.

## 2024-05-10 LAB — COLOGUARD: COLOGUARD: NEGATIVE

## 2024-05-18 ENCOUNTER — Encounter: Admitting: Family Medicine
